# Patient Record
Sex: Male | Born: 1985
Health system: Southern US, Community
[De-identification: ages and names within clinical notes are randomized; demographics above are authoritative.]

## PROBLEM LIST (undated history)

## (undated) DIAGNOSIS — S86019A Strain of unspecified Achilles tendon, initial encounter: Secondary | ICD-10-CM

## (undated) DIAGNOSIS — F4310 Post-traumatic stress disorder, unspecified: Secondary | ICD-10-CM

## (undated) DIAGNOSIS — K219 Gastro-esophageal reflux disease without esophagitis: Secondary | ICD-10-CM

## (undated) DIAGNOSIS — F410 Panic disorder [episodic paroxysmal anxiety] without agoraphobia: Secondary | ICD-10-CM

## (undated) HISTORY — PX: ACHILLES TENDON REPAIR: SUR1153

## (undated) HISTORY — PX: UPPER GASTROINTESTINAL ENDOSCOPY: SHX188

---

## 2007-07-15 ENCOUNTER — Other Ambulatory Visit: Payer: Self-pay

## 2007-07-15 ENCOUNTER — Emergency Department: Payer: Self-pay | Admitting: Unknown Physician Specialty

## 2011-11-15 ENCOUNTER — Emergency Department (HOSPITAL_BASED_OUTPATIENT_CLINIC_OR_DEPARTMENT_OTHER)
Admission: EM | Admit: 2011-11-15 | Discharge: 2011-11-15 | Disposition: A | Discharge status: Home / Self Care | Payer: Worker's Compensation | Attending: Emergency Medicine | Admitting: Emergency Medicine

## 2011-11-15 ENCOUNTER — Emergency Department (INDEPENDENT_AMBULATORY_CARE_PROVIDER_SITE_OTHER): Payer: Worker's Compensation

## 2011-11-15 ENCOUNTER — Encounter (HOSPITAL_BASED_OUTPATIENT_CLINIC_OR_DEPARTMENT_OTHER): Payer: Self-pay | Admitting: *Deleted

## 2011-11-15 DIAGNOSIS — S8392XA Sprain of unspecified site of left knee, initial encounter: Secondary | ICD-10-CM

## 2011-11-15 DIAGNOSIS — X500XXA Overexertion from strenuous movement or load, initial encounter: Secondary | ICD-10-CM | POA: Insufficient documentation

## 2011-11-15 DIAGNOSIS — Y9289 Other specified places as the place of occurrence of the external cause: Secondary | ICD-10-CM | POA: Insufficient documentation

## 2011-11-15 DIAGNOSIS — F17200 Nicotine dependence, unspecified, uncomplicated: Secondary | ICD-10-CM | POA: Insufficient documentation

## 2011-11-15 DIAGNOSIS — IMO0002 Reserved for concepts with insufficient information to code with codable children: Secondary | ICD-10-CM | POA: Insufficient documentation

## 2011-11-15 DIAGNOSIS — M25569 Pain in unspecified knee: Secondary | ICD-10-CM

## 2011-11-15 DIAGNOSIS — K219 Gastro-esophageal reflux disease without esophagitis: Secondary | ICD-10-CM | POA: Insufficient documentation

## 2011-11-15 HISTORY — DX: Post-traumatic stress disorder, unspecified: F43.10

## 2011-11-15 HISTORY — DX: Strain of unspecified achilles tendon, initial encounter: S86.019A

## 2011-11-15 HISTORY — DX: Gastro-esophageal reflux disease without esophagitis: K21.9

## 2011-11-15 HISTORY — DX: Panic disorder (episodic paroxysmal anxiety): F41.0

## 2011-11-15 MED ORDER — OXYCODONE-ACETAMINOPHEN 5-325 MG PO TABS: 1.0000 | ORAL_TABLET | Freq: Four times a day (QID) | ORAL | Status: AC | PRN

## 2011-11-15 MED ORDER — OXYCODONE-ACETAMINOPHEN 5-325 MG PO TABS
1.0000 | ORAL_TABLET | Freq: Once | ORAL | Status: AC
  Administered 2011-11-15: 1 via ORAL
  Filled 2011-11-15: qty 1

## 2011-11-15 NOTE — ED Provider Notes (Signed)
History     CSN: [id number]  Arrival date & time 11/15/11  6433   First MD Initiated Contact with Patient 11/15/11 1000      Chief Complaint  Patient presents with  . Knee Pain    (Consider location/radiation/quality/duration/timing/severity/associated sxs/prior treatment) Patient is a 26 y.o. male presenting with knee pain. The history is provided by the patient.  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 He states his foot got caught between 2 boxes in his left knee twisted. He was working later that he get out and a box of soup landed on the medial aspect of his knee. He's had pain. No numbness or weakness. No other injury.  Past Medical History  Diagnosis Date  . Post traumatic stress disorder   . Achilles tendon rupture   . Panic attack   . GERD (gastroesophageal reflux disease)     Past Surgical History  Procedure Date  . Achilles tendon repair   . Upper gastrointestinal endoscopy     No family history on file.  History  Substance Use Topics  . Smoking status: Current Everyday Smoker -- 1.0 packs/day for 10 years    Types: Cigarettes  . Smokeless tobacco: Not on file  . Alcohol Use: Yes     occassionally      Review of Systems  Musculoskeletal: Positive for joint swelling. Negative for arthralgias.  Skin: Negative for pallor and rash.  Neurological: Negative for weakness and numbness.    Allergies  Phenergan  Home Medications   Current Outpatient Rx  Name Route Sig Dispense Refill  . CLONIDINE HCL 0.1 MG PO TABS Oral Take 0.1 mg by mouth 2 (two) times daily.    Marland Kitchen PRAZOSIN HCL 1 MG PO CAPS Oral Take 1 mg by mouth at bedtime.    . SERTRALINE HCL 100 MG PO TABS Oral Take 100 mg by mouth daily.    Marland Kitchen ZOLPIDEM TARTRATE 10 MG PO TABS Oral Take 10 mg by mouth at bedtime as needed.    . OXYCODONE-ACETAMINOPHEN 5-325 MG PO TABS Oral Take 1-2 tablets by mouth every 6 (six) hours as needed for pain. 20 tablet 0    BP 123/74  Pulse  83  Temp(Src) 97.6 F (36.4 C) (Oral)  Resp 20  Ht 5\' 7"  (1.702 m)  Wt 160 lb (72.576 kg)  BMI 25.06 kg/m2  SpO2 98%  Physical Exam  Constitutional: He is oriented to person, place, and time. He appears well-developed.  HENT:  Head: Normocephalic.  Musculoskeletal:       Tenderness to left knee anteriorly and medially. Pain with valgus stress. Neurovascularly intact distally. No effusion. Range of motion mildly decreased due to pain.  Neurological: He is alert and oriented to person, place, and time.    ED Course  Procedures (including critical care time)  Labs Reviewed - No data to display Dg Knee Complete 4 Views Left  11/15/2011  *RADIOLOGY REPORT*  Clinical Data: Twisting injury, pain.  LEFT KNEE - COMPLETE 4+ VIEW  Comparison: None.  Findings: Imaged bones, joints and soft tissues appear normal.  IMPRESSION: Negative study.  Original Report Authenticated By: Bernadene Bell. D'ALESSIO, M.D.     1. Sprain of left knee       MDM  Knee pain. Negative x-ray. Was given an immobilizer pain medicines and will followup.        Juliet Rude. Rubin Payor, MD 11/15/11 [phone]

## 2011-11-15 NOTE — ED Notes (Signed)
Patient states he was at work and caught his foot between two boxes and his left knee twisted.  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  States his knee gave out on him while carrying a case of soup cans causing him to fall and the case of soup landed on his medial side of his left knee.

## 2016-02-01 ENCOUNTER — Ambulatory Visit: Payer: Self-pay | Admitting: Family Medicine

## 2016-06-11 ENCOUNTER — Encounter (HOSPITAL_BASED_OUTPATIENT_CLINIC_OR_DEPARTMENT_OTHER): Payer: Self-pay

## 2016-06-11 ENCOUNTER — Emergency Department (HOSPITAL_BASED_OUTPATIENT_CLINIC_OR_DEPARTMENT_OTHER)
Admission: EM | Admit: 2016-06-11 | Discharge: 2016-06-11 | Disposition: A | Discharge status: Home / Self Care | Payer: Worker's Compensation | Attending: Emergency Medicine | Admitting: Emergency Medicine

## 2016-06-11 DIAGNOSIS — Y990 Civilian activity done for income or pay: Secondary | ICD-10-CM | POA: Insufficient documentation

## 2016-06-11 DIAGNOSIS — Y93F2 Activity, caregiving, lifting: Secondary | ICD-10-CM | POA: Insufficient documentation

## 2016-06-11 DIAGNOSIS — M5431 Sciatica, right side: Secondary | ICD-10-CM

## 2016-06-11 DIAGNOSIS — X500XXA Overexertion from strenuous movement or load, initial encounter: Secondary | ICD-10-CM | POA: Insufficient documentation

## 2016-06-11 DIAGNOSIS — Y929 Unspecified place or not applicable: Secondary | ICD-10-CM | POA: Insufficient documentation

## 2016-06-11 DIAGNOSIS — S39012A Strain of muscle, fascia and tendon of lower back, initial encounter: Secondary | ICD-10-CM

## 2016-06-11 DIAGNOSIS — F17210 Nicotine dependence, cigarettes, uncomplicated: Secondary | ICD-10-CM | POA: Insufficient documentation

## 2016-06-11 LAB — CBC WITH DIFFERENTIAL/PLATELET
Basophils Absolute: 0 10*3/uL (ref 0.0–0.1)
Basophils Relative: 0 %
EOS ABS: 0.1 10*3/uL (ref 0.0–0.7)
EOS PCT: 1 %
HCT: 37.9 % — ABNORMAL LOW (ref 39.0–52.0)
Hemoglobin: 12.9 g/dL — ABNORMAL LOW (ref 13.0–17.0)
LYMPHS ABS: 1.5 10*3/uL (ref 0.7–4.0)
Lymphocytes Relative: 19 %
MCH: 32.9 pg (ref 26.0–34.0)
MCHC: 34 g/dL (ref 30.0–36.0)
MCV: 96.7 fL (ref 78.0–100.0)
MONOS PCT: 5 %
Monocytes Absolute: 0.4 10*3/uL (ref 0.1–1.0)
Neutro Abs: 6 10*3/uL (ref 1.7–7.7)
Neutrophils Relative %: 75 %
PLATELETS: 194 10*3/uL (ref 150–400)
RBC: 3.92 MIL/uL — ABNORMAL LOW (ref 4.22–5.81)
RDW: 12.8 % (ref 11.5–15.5)
WBC: 8.1 10*3/uL (ref 4.0–10.5)

## 2016-06-11 LAB — BASIC METABOLIC PANEL
Anion gap: 5 (ref 5–15)
BUN: 7 mg/dL (ref 6–20)
CO2: 26 mmol/L (ref 22–32)
CREATININE: 0.7 mg/dL (ref 0.61–1.24)
Calcium: 9 mg/dL (ref 8.9–10.3)
Chloride: 107 mmol/L (ref 101–111)
GFR calc Af Amer: >60 mL/min (ref >60)
Glucose, Bld: 90 mg/dL (ref 65–99)
Potassium: 3.8 mmol/L (ref 3.5–5.1)
SODIUM: 138 mmol/L (ref 135–145)

## 2016-06-11 MED ORDER — HYDROMORPHONE HCL 1 MG/ML IJ SOLN
INTRAMUSCULAR | Status: AC
  Filled 2016-06-11: qty 1

## 2016-06-11 MED ORDER — DIAZEPAM 5 MG/ML IJ SOLN
5.0000 mg | Freq: Once | INTRAMUSCULAR | Status: AC
  Administered 2016-06-11: 5 mg via INTRAVENOUS
  Filled 2016-06-11: qty 2

## 2016-06-11 MED ORDER — KETOROLAC TROMETHAMINE 60 MG/2ML IM SOLN
60.0000 mg | Freq: Once | INTRAMUSCULAR | Status: AC
  Administered 2016-06-11: 60 mg via INTRAMUSCULAR
  Filled 2016-06-11: qty 2

## 2016-06-11 MED ORDER — HYDROMORPHONE HCL 1 MG/ML IJ SOLN
1.0000 mg | Freq: Once | INTRAMUSCULAR | Status: AC
  Administered 2016-06-11: 1 mg via INTRAVENOUS
  Filled 2016-06-11: qty 1

## 2016-06-11 MED ORDER — DIAZEPAM 5 MG PO TABS: 5.0000 mg | ORAL_TABLET | Freq: Four times a day (QID) | ORAL | 0 refills | Status: DC | PRN

## 2016-06-11 MED ORDER — IBUPROFEN 600 MG PO TABS: 600.0000 mg | ORAL_TABLET | Freq: Four times a day (QID) | ORAL | 0 refills | Status: AC | PRN

## 2016-06-11 MED ORDER — HYDROCODONE-ACETAMINOPHEN 5-325 MG PO TABS: 1.0000 | ORAL_TABLET | Freq: Four times a day (QID) | ORAL | 0 refills | Status: AC | PRN

## 2016-06-11 MED ORDER — MORPHINE SULFATE (PF) 4 MG/ML IV SOLN
4.0000 mg | Freq: Once | INTRAVENOUS | Status: AC
  Administered 2016-06-11: 4 mg via INTRAMUSCULAR
  Filled 2016-06-11: qty 1

## 2016-06-11 MED ORDER — DIAZEPAM 5 MG/ML IJ SOLN
5.0000 mg | Freq: Once | INTRAMUSCULAR | Status: AC
  Administered 2016-06-11: 5 mg via INTRAMUSCULAR
  Filled 2016-06-11: qty 2

## 2016-06-11 MED FILL — HYDROCODON-APAP 5-325: 5-325 | 2 days supply | Qty: 10 | Fill #0

## 2016-06-11 MED FILL — IBUPROFEN 600 MG TABLET: 600 | 7 days supply | Qty: 30 | Fill #0

## 2016-06-11 MED FILL — diazePAM 5 MG TABS: 5 | 2 days supply | Qty: 10 | Fill #0

## 2016-06-11 NOTE — Discharge Instructions (Signed)
Take motrin for pain.  Take valium as needed for spasms. Do NOT drive with it  Take vicodin for severe pain.   No heavy lifting for a week.   See neurosurgery for follow up to discuss options   Return to ER if you have severe pain, unable to walk, weakness, numbness, trouble urinating.

## 2016-06-11 NOTE — ED Notes (Addendum)
After scanning dilaudid at bedside, this RN dropped the whole vial into the sharps box before withdrawing the medication. Consulting civil engineerCharge RN and pharmacy advised. Pharmacy tech advised to over ride in the pyxsis to get pt a new dose.

## 2016-06-11 NOTE — ED Triage Notes (Signed)
C/o lower back pain that radiates down right leg after [facility]-steady gait

## 2016-06-11 NOTE — ED Provider Notes (Signed)
[facility] Provider [facility]   CSN: [id number] Arrival date & time: 06/11/16  1139  First Provider Contact:  First MD Initiated Contact with Patient 06/11/16 1144        History   Chief Complaint Chief Complaint  Patient presents with  . Back Pain    HPI Kevin Stephens is a 30 y.o. male otherwise healthy here presenting with back pain.  Patient states that he is at work and was picking up something heavy and had acute onset of back pain radiating down the right leg.  He felt like something may have popped but denies any fall or injury to the back. Denies any weakness or numbness or incontinence. Denies any previous history of spinal issues or any spinal surgeries.     The history is provided by the patient.    Past Medical History:  Diagnosis Date  . Achilles tendon rupture   . GERD (gastroesophageal reflux disease)   . Panic attack   . Post traumatic stress disorder     There are no active problems to display for this patient.   Past Surgical History:  Procedure Laterality Date  . ACHILLES TENDON REPAIR    . UPPER GASTROINTESTINAL ENDOSCOPY         Home Medications    Prior to Admission medications   Not on File    Family History No family history on file.  Social History Social History  Substance Use Topics  . Smoking status: Current Every Day Smoker    Packs/day: 1.00    Years: 10.00    Types: Cigarettes  . Smokeless tobacco: Never Used  . Alcohol use Yes     Comment: occassionally     Allergies   Promethazine hcl   Review of Systems Review of Systems  Musculoskeletal: Positive for back pain.  All other systems reviewed and are negative.    Physical Exam Updated Vital Signs BP 138/93 (BP Location: Right Arm)   Pulse 90   Temp 98.3 F (36.8 C) (Oral)   Resp 16   Ht  (1.727 m)   Wt 160 lb (72.6 kg)   SpO2 99%   BMI 24.33 kg/m   Physical Exam  Constitutional:  Uncomfortable   HENT:  Head: Normocephalic.  Eyes:  EOM are normal. Pupils are equal, round, and reactive to light.  Neck: Normal range of motion.  Cardiovascular: Normal rate, regular rhythm and normal heart sounds.   Pulmonary/Chest: Effort normal and breath sounds normal. No respiratory distress. He has no wheezes. He has no rales.  Abdominal: Soft. Bowel sounds are normal. He exhibits no distension. There is no tenderness. There is no guarding.  Musculoskeletal: Normal range of motion.  R paralumbar spasms. No obvious midline tenderness. + straight leg raise on R side.   Neurological: He is alert.  No saddle anesthesia. + straight leg raise on the R side   Skin: Skin is warm.  Psychiatric: He has a normal mood and affect.  Nursing note and vitals reviewed.    ED Treatments / Results  Labs (all labs ordered are listed, but only abnormal results are displayed) Labs Reviewed  CBC WITH DIFFERENTIAL/PLATELET - Abnormal; Notable for the following:       Result Value   RBC 3.92 (*)    Hemoglobin 12.9 (*)    HCT 37.9 (*)    All other components within normal limits  BASIC METABOLIC PANEL    EKG  EKG Interpretation None  Radiology No results found.  Procedures Procedures (including critical care time)  Medications Ordered in ED Medications  HYDROmorphone (DILAUDID) 1 MG/ML injection (not administered)  ketorolac (TORADOL) injection 60 mg (60 mg Intramuscular Given 06/11/16 1213)  diazepam (VALIUM) injection 5 mg (5 mg Intramuscular Given 06/11/16 1213)  morphine 4 MG/ML injection 4 mg (4 mg Intramuscular Given 06/11/16 1213)  HYDROmorphone (DILAUDID) injection 1 mg (1 mg Intravenous Given 06/11/16 1319)  diazepam (VALIUM) injection 5 mg (5 mg Intravenous Given 06/11/16 1319)     Initial Impression / Assessment and Plan / ED Course  I have reviewed the triage vital signs and the nursing notes.  Pertinent labs & imaging results that were available during my care of the patient were reviewed by me and considered in my medical  decision making (see chart for details).  Clinical Course    Kevin Stephens is a 30 y.o. male here with back pain with sciatica. + straight leg raise on the right, but neurovascular intact. No trauma or injury. Will give pain meds and reassess.   1 pm Still in severe pain despite IM meds. Will place IV and give IV pain meds.   2:30 PM Pain improved. Able to ambulate to the bathroom. Likely sciatica. Will dc home with motrin, valium, prn vicodin. Will have him see neurosurgery.    Final Clinical Impressions(s) / ED Diagnoses   Final diagnoses:  None    New Prescriptions New Prescriptions   No medications on file     [name], MD 06/11/16 1431

## 2016-06-17 ENCOUNTER — Emergency Department (HOSPITAL_BASED_OUTPATIENT_CLINIC_OR_DEPARTMENT_OTHER): Payer: Worker's Compensation

## 2016-06-17 ENCOUNTER — Encounter (HOSPITAL_BASED_OUTPATIENT_CLINIC_OR_DEPARTMENT_OTHER): Payer: Self-pay | Admitting: *Deleted

## 2016-06-17 ENCOUNTER — Emergency Department (HOSPITAL_BASED_OUTPATIENT_CLINIC_OR_DEPARTMENT_OTHER)
Admission: EM | Admit: 2016-06-17 | Discharge: 2016-06-17 | Disposition: A | Discharge status: Home / Self Care | Payer: Worker's Compensation | Attending: Emergency Medicine | Admitting: Emergency Medicine

## 2016-06-17 DIAGNOSIS — M545 Low back pain: Secondary | ICD-10-CM | POA: Diagnosis present

## 2016-06-17 DIAGNOSIS — R3915 Urgency of urination: Secondary | ICD-10-CM | POA: Diagnosis not present

## 2016-06-17 DIAGNOSIS — F17210 Nicotine dependence, cigarettes, uncomplicated: Secondary | ICD-10-CM | POA: Insufficient documentation

## 2016-06-17 DIAGNOSIS — M5441 Lumbago with sciatica, right side: Secondary | ICD-10-CM | POA: Insufficient documentation

## 2016-06-17 DIAGNOSIS — M5431 Sciatica, right side: Secondary | ICD-10-CM

## 2016-06-17 MED ORDER — DIAZEPAM 5 MG PO TABS
10.0000 mg | ORAL_TABLET | Freq: Once | ORAL | Status: AC
  Administered 2016-06-17: 10 mg via ORAL
  Filled 2016-06-17: qty 2

## 2016-06-17 MED ORDER — DIAZEPAM 5 MG PO TABS: 5.0000 mg | ORAL_TABLET | Freq: Four times a day (QID) | ORAL | 0 refills | Status: AC | PRN

## 2016-06-17 MED ORDER — TRAMADOL HCL 50 MG PO TABS
50.0000 mg | ORAL_TABLET | Freq: Four times a day (QID) | ORAL | 0 refills | Status: AC | PRN
Start: 2016-06-17 — End: ?

## 2016-06-17 MED ORDER — PREDNISONE 20 MG PO TABS: ORAL_TABLET | ORAL | 0 refills | Status: AC

## 2016-06-17 MED ORDER — OXYCODONE-ACETAMINOPHEN 5-325 MG PO TABS
2.0000 | ORAL_TABLET | Freq: Once | ORAL | Status: AC
  Administered 2016-06-17: 2 via ORAL
  Filled 2016-06-17: qty 2

## 2016-06-17 MED ORDER — KETOROLAC TROMETHAMINE 60 MG/2ML IM SOLN
60.0000 mg | Freq: Once | INTRAMUSCULAR | Status: AC
  Administered 2016-06-17: 60 mg via INTRAMUSCULAR
  Filled 2016-06-17: qty 2

## 2016-06-17 NOTE — ED Notes (Signed)
Attempted to walk pt and he stated he was in pain

## 2016-06-17 NOTE — ED Triage Notes (Signed)
Back pain. States he has been trying to get in touch with his referral this am with any success. Felt it would be better for continuity of care if he stuck with coming to the ED.

## 2016-06-17 NOTE — ED Notes (Signed)
Pt. Was seen here on last Tuesday for same complaint and reports he is in pain

## 2016-06-17 NOTE — Discharge Instructions (Signed)
Please call and [facility] for [facility] of your [facility].  After that, call neurosurgeon Dr. Lonie Peakram's office after 9am for outpatient follow up.

## 2016-06-17 NOTE — ED Notes (Signed)
[facility]--spoke with Kevin Stephens for consult

## 2016-06-17 NOTE — ED Provider Notes (Signed)
[facility] Provider Note   CSN: [id number]   By signing my name below, I, Phillis HaggisGabriella Gaje, attest that this documentation has been prepared under the direction and in the presence of Fayrene HelperBowie Camdan Burdi, PA-C. Electronically Signed: Phillis HaggisGabriella Gaje, ED Scribe. 06/17/16. 4:20 PM.  Arrival date & time: 06/17/16  1355  History   Chief Complaint Chief Complaint  Patient presents with  . Back Pain   The history is provided by the patient. No language interpreter was used.  HPI Comments: Kevin Stephens is a 30 y.o. male who presents to the Emergency Department complaining of gradually worsening, right sided, shooting lower back pain that intermittently radiates down the right leg to the knee onset one week ago. Pt reports associated urinary urgency and tingling in the toes of his right foot. He states that he had an incident of having a BM on himself yesterday. He states that he had the sensation of having a BM, but it came much quicker than he was expecting or prepared for. He reports worsening pain with sudden movements. Pt was seen last week in the ED and prescribed multiple pain medications but has since run out. He was told to follow up with [facility]. He says that he has called multiple times but has not gotten through to them. He initially injured his back with heavy lifting at work. He denies IVDU, hx of cancer, numbness, weakness, dysuria, or bladder incontinence.   Past Medical History:  Diagnosis Date  . Achilles tendon rupture   . GERD (gastroesophageal reflux disease)   . Panic attack   . Post traumatic stress disorder     There are no active problems to display for this patient.   Past Surgical History:  Procedure Laterality Date  . ACHILLES TENDON REPAIR    . UPPER GASTROINTESTINAL ENDOSCOPY        Home Medications    Prior to Admission medications   Medication Sig Start Date End Date Taking? Authorizing Provider  diazepam (VALIUM) 5 MG tablet Take 1  tablet (5 mg total) by mouth every 6 (six) hours as needed for anxiety (spasms). 06/11/16   Charlynne Panderavid Hsienta Yao, MD  HYDROcodone-acetaminophen (NORCO/VICODIN) 5-325 MG tablet Take 1 tablet by mouth every 6 (six) hours as needed. 06/11/16   Charlynne Panderavid Hsienta Yao, MD  ibuprofen (ADVIL,MOTRIN) 600 MG tablet Take 1 tablet (600 mg total) by mouth every 6 (six) hours as needed. 06/11/16   Charlynne Panderavid Hsienta Yao, MD    Family History No family history on file.  Social History Social History  Substance Use Topics  . Smoking status: Current Every Day Smoker    Packs/day: 1.00    Years: 10.00    Types: Cigarettes  . Smokeless tobacco: Never Used  . Alcohol use Yes     Comment: occassionally     Allergies   Promethazine hcl   Review of Systems Review of Systems  Genitourinary: Positive for urgency. Negative for dysuria and hematuria.  Musculoskeletal: Positive for back pain.  Neurological: Negative for weakness and numbness.     Physical Exam Updated Vital Signs BP 140/100   Pulse 72   Temp 97.7 F (36.5 C) (Oral)   Resp 16   Ht 5\' 8"  (1.727 m)   Wt 160 lb (72.6 kg)   SpO2 100%   BMI 24.33 kg/m   Physical Exam  Constitutional: He is oriented to person, place, and time. He appears well-developed and well-nourished.  HENT:  Head: Normocephalic and atraumatic.  Eyes:  Conjunctivae and EOM are normal. Pupils are equal, round, and reactive to light.  Neck: Normal range of motion. Neck supple.  Genitourinary:  Genitourinary Comments: Rectal tone intact; chaperone present  Musculoskeletal:  Gait normal; NVI; brisk cap refill. Tenderness of lumbar spine to L3-S1; tenderness to palpation of right paralumbar spinal muscles with no overlying skin changes. Patellar reflexes intact bilaterally  Neurological: He is alert and oriented to person, place, and time. Coordination normal.  4/5 Strength to RLE; 5/5 strength to LLE. Sensation intact  Skin: Skin is warm and dry.  Psychiatric: He has a normal  mood and affect. His behavior is normal.  Nursing note and vitals reviewed.    ED Treatments / Results  DIAGNOSTIC STUDIES: Oxygen Saturation is 100% on RA, normal by my interpretation.    COORDINATION OF CARE: 4:19 PM-Discussed treatment plan which includes Toradol with pt at bedside and pt agreed to plan.    Labs (all labs ordered are listed, but only abnormal results are displayed) Labs Reviewed - No data to display  EKG  EKG Interpretation None       Radiology Dg Lumbar Spine Complete  Result Date: 06/17/2016 CLINICAL DATA:  Lifting injury 1 week ago, low back pain, right lower extremity pain EXAM: LUMBAR SPINE - COMPLETE 4+ VIEW COMPARISON:  12/20/2014 FINDINGS: Five views of the lumbar spine submitted. No acute fracture or subluxation. Alignment, disc spaces and vertebral body heights are preserved. IMPRESSION: No acute fracture or subluxation. Electronically Signed   By: Natasha MeadLiviu  Pop M.D.   On: 06/17/2016 16:57    Procedures Procedures (including critical care time)  Medications Ordered in ED Medications  ketorolac (TORADOL) injection 60 mg (60 mg Intramuscular Given 06/17/16 1631)  diazepam (VALIUM) tablet 10 mg (10 mg Oral Given 06/17/16 1630)     Initial Impression / Assessment and Plan / ED Course  I have reviewed the triage vital signs and the nursing notes.  Pertinent labs & imaging results that were available during my care of the patient were reviewed by me and considered in my medical decision making (see chart for details).  Clinical Course    BP 140/100   Pulse 72   Temp 97.7 F (36.5 C) (Oral)   Resp 16   Ht 5\' 8"  (1.727 m)   Wt 72.6 kg   SpO2 100%   BMI 24.33 kg/m    Final Clinical Impressions(s) / ED Diagnoses   Final diagnoses:  Sciatica of right side   I personally performed the services described in this documentation, which was scribed in my presence. The recorded information has been reviewed and is accurate.     New  Prescriptions New Prescriptions   PREDNISONE (DELTASONE) 20 MG TABLET    3 tabs po day one, then 2 tabs daily x 4 days   TRAMADOL (ULTRAM) 50 MG TABLET    Take 1 tablet (50 mg total) by mouth every 6 (six) hours as needed.   5:56 PM Pt here with R side sciatica.  No true red flags.  Able to ambulate with discomfort.  Normal rectal tone, able to urinate.  I have consulted with neurosurgeon Dr. Wynetta Emeryram who recommend outpt MRI of Lspine and to f/u outpt for further care.     Fayrene HelperBowie Dinorah Masullo, PA-C 06/17/16 1756     Fayrene HelperBowie Kazmir Oki, PA-C 06/17/16 1757    Loren Raceravid Yelverton, MD 06/21/16 1600

## 2017-09-25 IMAGING — DX DG LUMBAR SPINE COMPLETE 4+V
5 series · 5 of 5 positions shown · non-contrast
Comparison: 12/20/2014

CLINICAL DATA: Lifting injury 1 week ago, low back pain, right
lower extremity pain

EXAM:
LUMBAR SPINE - COMPLETE 4+ VIEW

[l-spine ap]
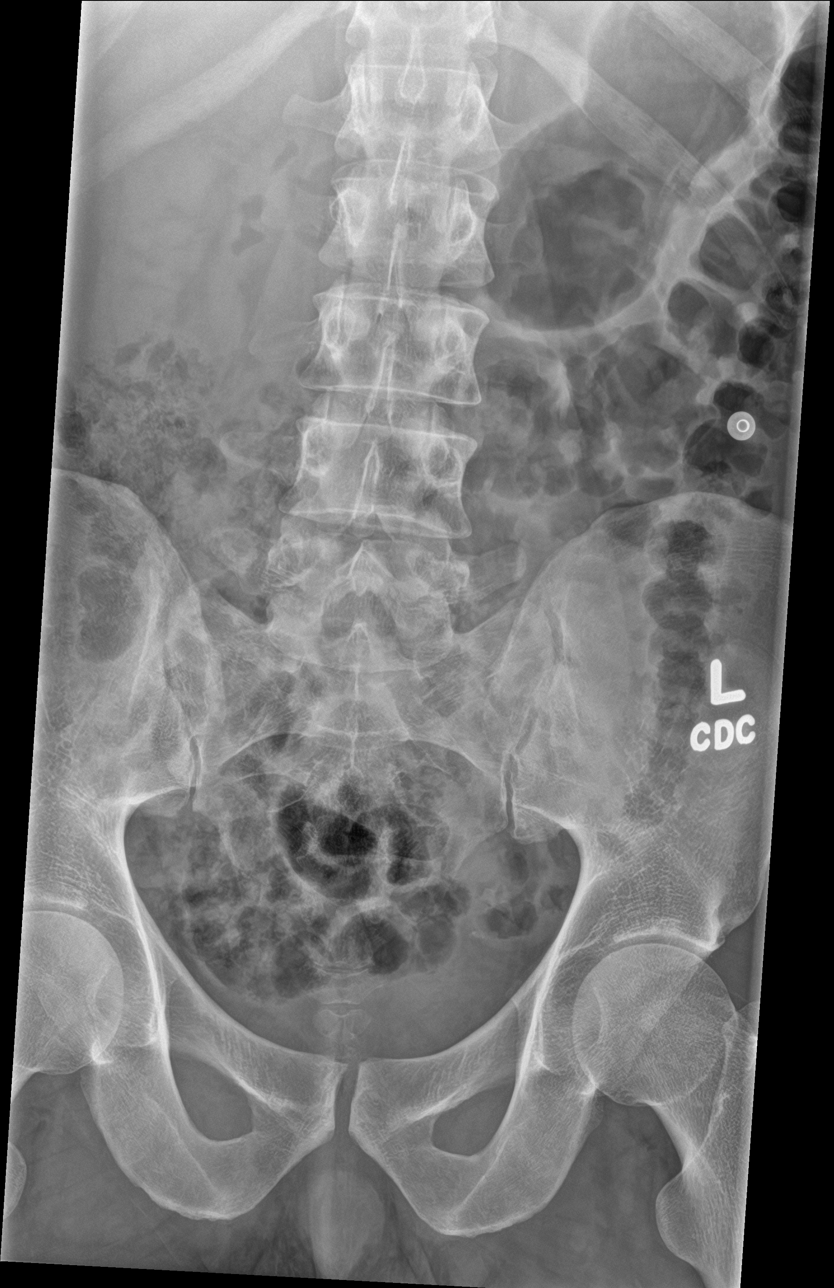

[l-spine obl (1 of 2)]
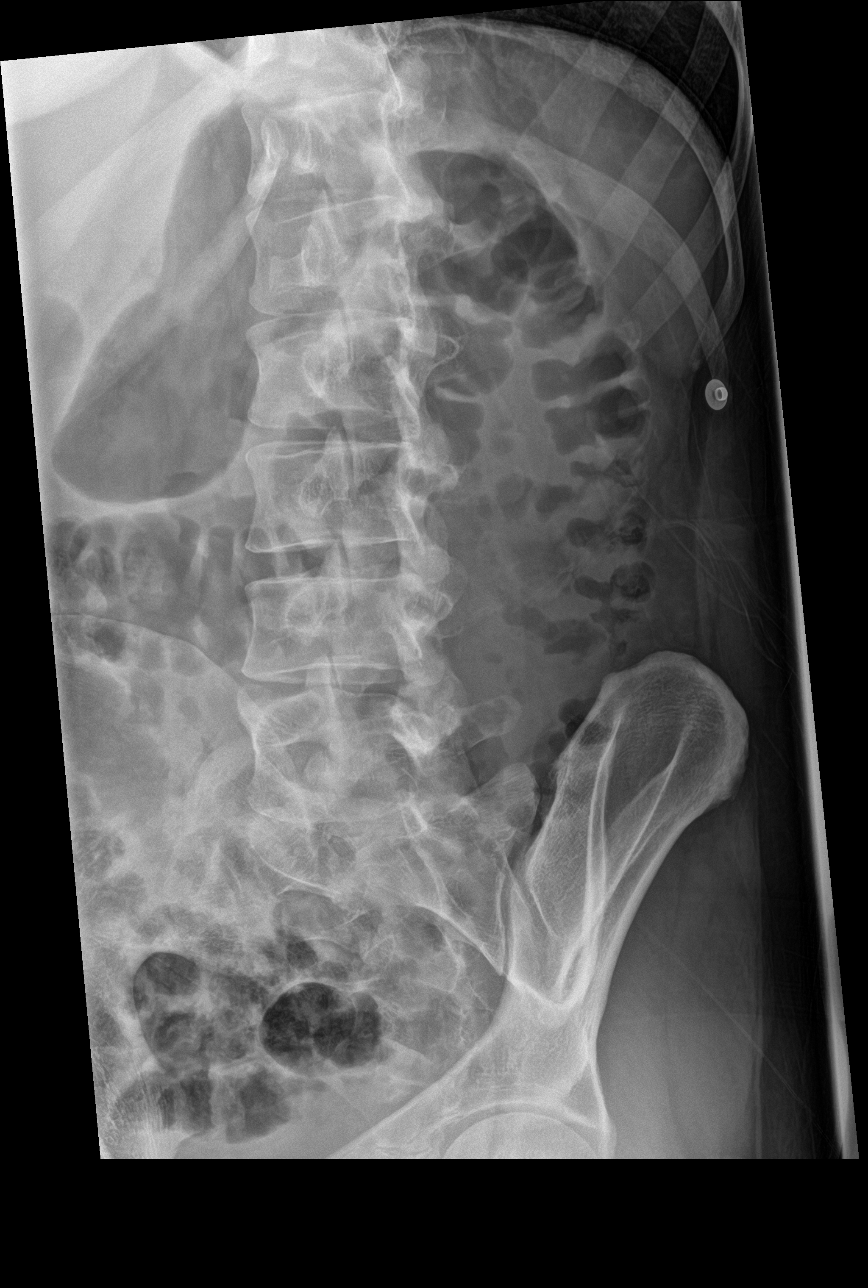

[l-spine obl (2 of 2)]
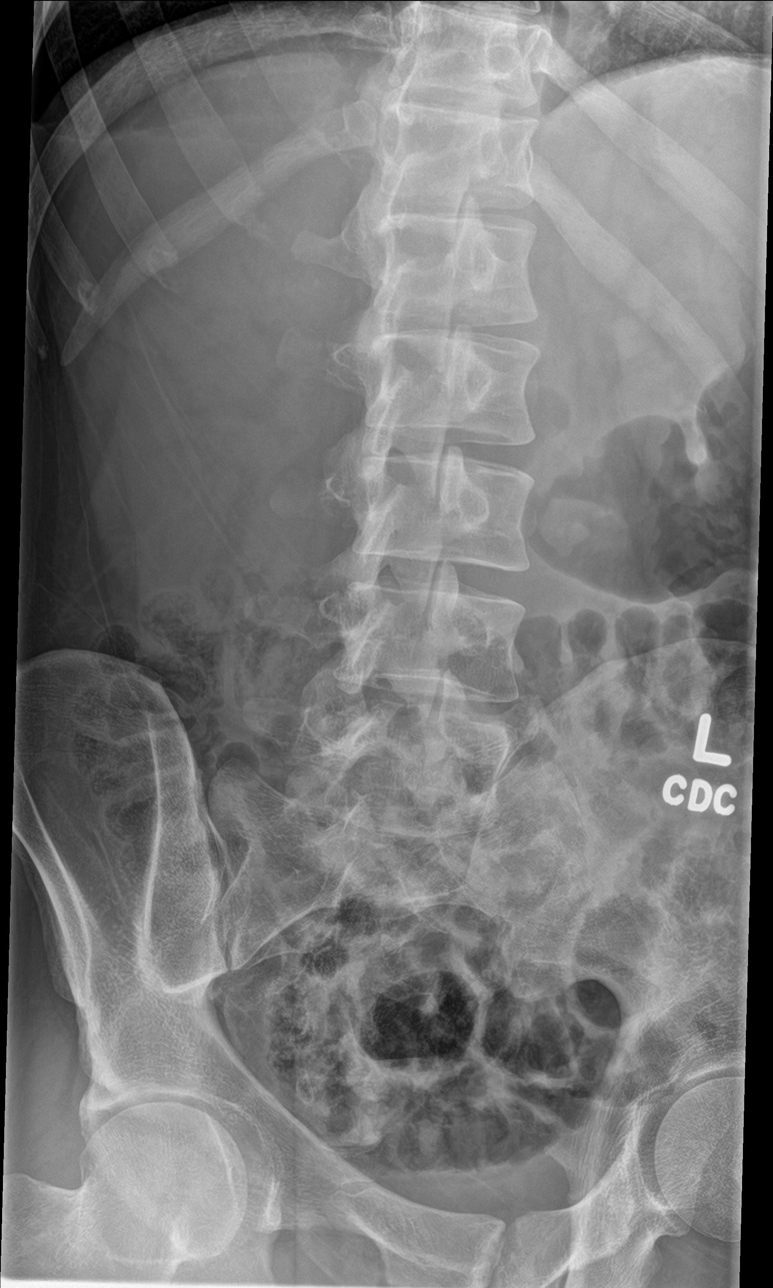

[l-spine lat]
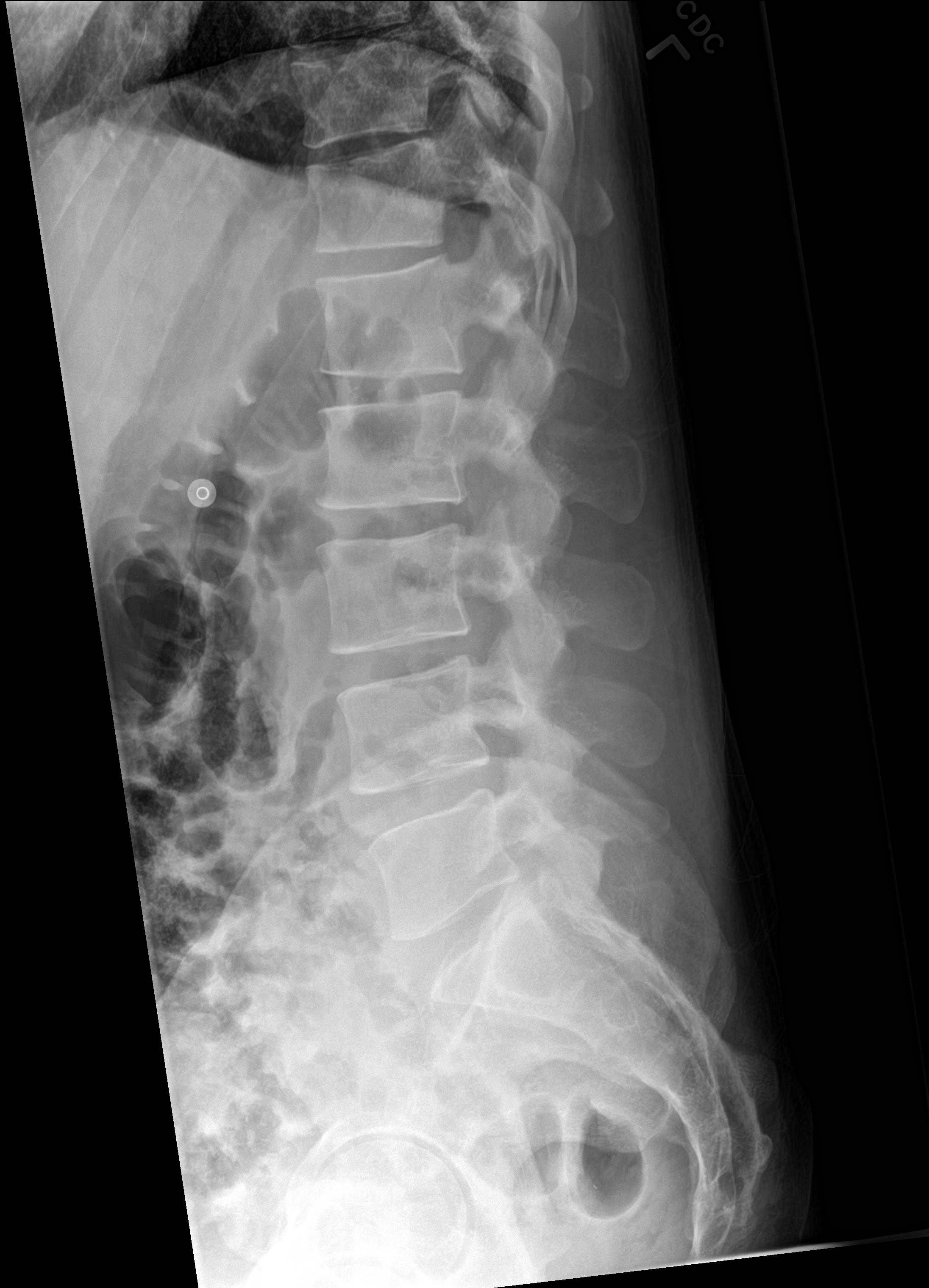

[l-spine spot]
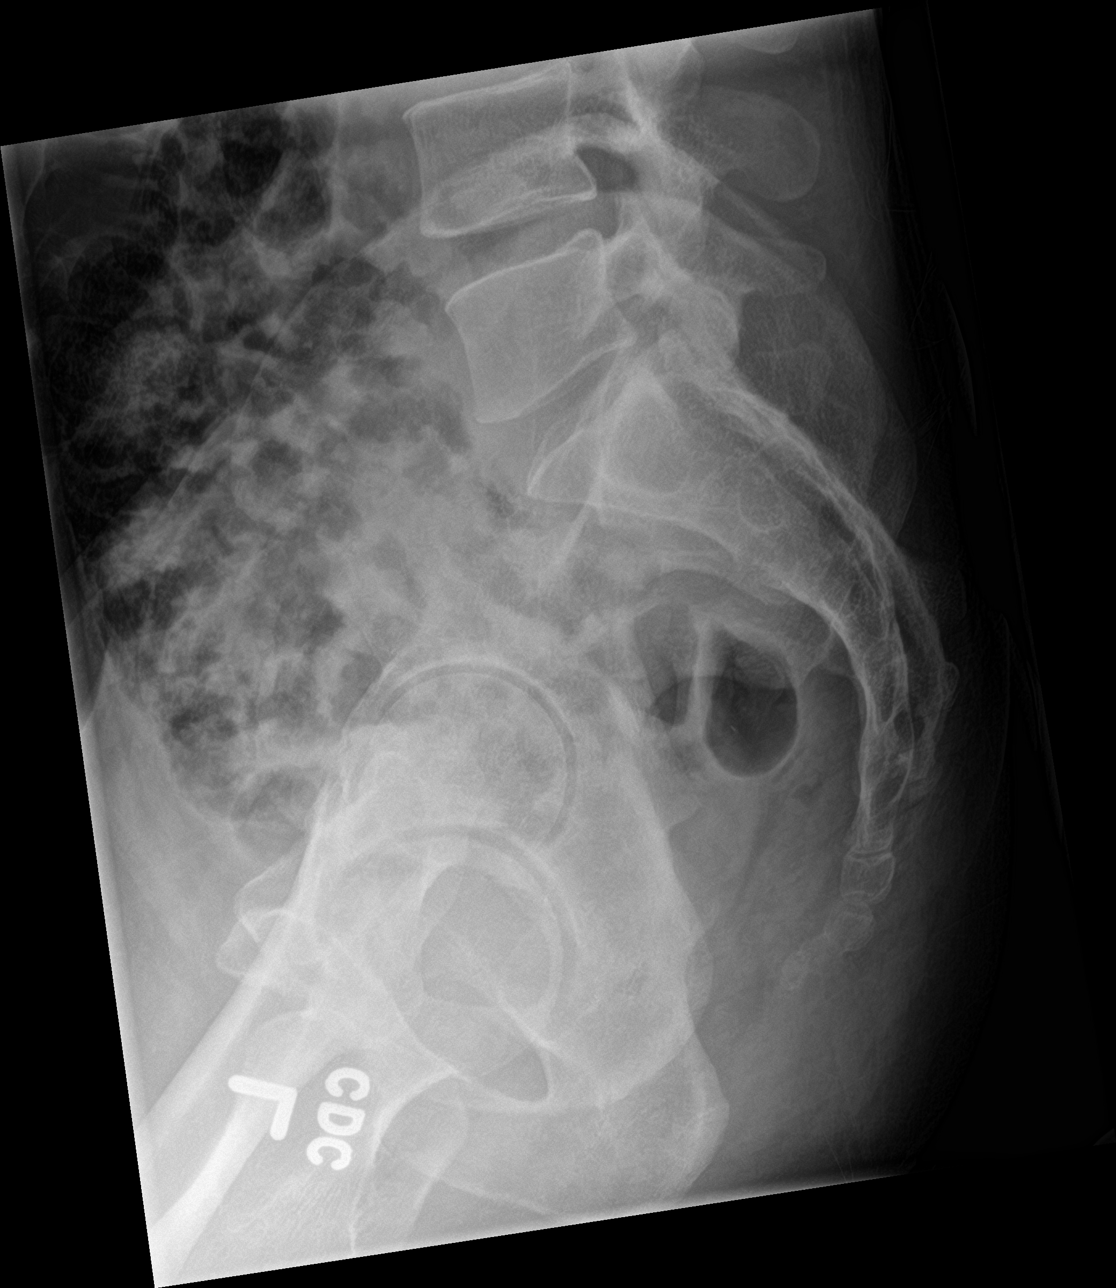

[5 of 5 positions shown; findings below may reference images not displayed]

FINDINGS: Five views of the lumbar spine submitted. No acute fracture or
subluxation. Alignment, disc spaces and vertebral body heights are
preserved.
IMPRESSION: No acute fracture or subluxation.

## 2018-03-27 ENCOUNTER — Emergency Department (HOSPITAL_BASED_OUTPATIENT_CLINIC_OR_DEPARTMENT_OTHER)
Admission: EM | Admit: 2018-03-27 | Discharge: 2018-03-27 | Disposition: A | Discharge status: Home / Self Care | Payer: Self-pay | Attending: Emergency Medicine | Admitting: Emergency Medicine

## 2018-03-27 ENCOUNTER — Encounter (HOSPITAL_BASED_OUTPATIENT_CLINIC_OR_DEPARTMENT_OTHER): Payer: Self-pay

## 2018-03-27 ENCOUNTER — Other Ambulatory Visit: Payer: Self-pay

## 2018-03-27 ENCOUNTER — Emergency Department (HOSPITAL_BASED_OUTPATIENT_CLINIC_OR_DEPARTMENT_OTHER): Payer: Self-pay

## 2018-03-27 DIAGNOSIS — R0602 Shortness of breath: Secondary | ICD-10-CM | POA: Insufficient documentation

## 2018-03-27 DIAGNOSIS — F17210 Nicotine dependence, cigarettes, uncomplicated: Secondary | ICD-10-CM | POA: Insufficient documentation

## 2018-03-27 DIAGNOSIS — Z79899 Other long term (current) drug therapy: Secondary | ICD-10-CM | POA: Insufficient documentation

## 2018-03-27 DIAGNOSIS — R55 Syncope and collapse: Secondary | ICD-10-CM | POA: Insufficient documentation

## 2018-03-27 NOTE — ED Notes (Signed)
ED Provider at bedside. 

## 2018-03-27 NOTE — ED Triage Notes (Signed)
Pt presents with SOB. Pt able to walk to treatment room in NAD. Breath sounds clear. Pt able to speak in complete sentences with no difficulty. 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 Seen at [facility] last week for allergies, started on zyrtec and prednisone. Pt also reports "having a spot on my lung".

## 2018-03-27 NOTE — ED Notes (Signed)
Pt ambulated to x-ray with steady gate. Pt SpO2 remained at 98% and HR 96.

## 2018-03-27 NOTE — ED Provider Notes (Signed)
[facility] EMERGENCY DEPARTMENT Provider Note   CSN: [id number] Arrival date & time: 03/27/18  2956     History   Chief Complaint Chief Complaint  Patient presents with  . Shortness of Breath    HPI Kevin Stephens is a 32 y.o. male.  The history is provided by the patient. No language interpreter was used.  Shortness of Breath    Kevin Stephens is a 32 y.o. male who presents to the Emergency Department complaining of sob. He presents to the ED for evaluation following an episode of shortness of breath. About two weeks ago he developed nasal congestion with cough and was seen in the [place] and diagnosed with allergies as well as a pulmonary nodule. He was treated with prednisone, Zyrtec and Flonase. Overall his cough and congestion has improved. X-ray and CT scan performed at the [facility] demonstrate a pulmonary nodule that's about 1 cm and he was referred to [facility] for further evaluation and management. Today when he was at work he was working in a non-climate controlled area and a forklift when he developed shortness of breath and soreness in his lungs. He felt lightheaded during this episode and felt like he could not catch his breath. He checked his blood pressure was noted to be 170 systolic with a heart rate of 213. He took his inhaler with no significant improvement in his symptoms and presents to the emergency department for further evaluation. On ED arrival he states that his breathing is back to normal. He had a fever one week ago, now gone. No hemoptysis, night sweats, weight loss, leg swelling or pain, vomiting, diarrhea, abdominal pain.  He smokes cigarettes and has a history of hypertension. No history of DVT or PE. Past Medical History:  Diagnosis Date  . Achilles tendon rupture   . GERD (gastroesophageal reflux disease)   . Panic attack   . Post traumatic stress disorder     There are no active problems to display for this patient.   Past Surgical History:    Procedure Laterality Date  . ACHILLES TENDON REPAIR    . UPPER GASTROINTESTINAL ENDOSCOPY          Home Medications    Prior to Admission medications   Medication Sig Start Date End Date Taking? Authorizing Provider  cetirizine (ZYRTEC) 10 MG tablet Take 10 mg by mouth daily.   Yes [provider]  diazepam (VALIUM) 5 MG tablet Take 1 tablet (5 mg total) by mouth every 6 (six) hours as needed for anxiety (spasms). 06/17/16  Yes Fayrene Helper, PA-C  diphenhydrAMINE (BENADRYL) 50 MG tablet Take 50 mg by mouth at bedtime as needed for itching.   Yes [provider]  fluticasone (FLONASE) 50 MCG/ACT nasal spray Place into both nostrils daily.   Yes [provider]  HYDROcodone-acetaminophen (NORCO/VICODIN) 5-325 MG tablet Take 1 tablet by mouth every 6 (six) hours as needed. 06/11/16  Yes Charlynne Pander, MD  ibuprofen (ADVIL,MOTRIN) 600 MG tablet Take 1 tablet (600 mg total) by mouth every 6 (six) hours as needed. 06/11/16  Yes Charlynne Pander, MD  predniSONE (DELTASONE) 20 MG tablet 3 tabs po day one, then 2 tabs daily x 4 days 06/17/16  Yes Fayrene Helper, PA-C  traMADol (ULTRAM) 50 MG tablet Take 1 tablet (50 mg total) by mouth every 6 (six) hours as needed. 06/17/16  Yes Fayrene Helper, PA-C    Family History No family history on file.  Social History Social  History   Tobacco Use  . Smoking status: Current Every Day Smoker    Packs/day: 1.00    Years: 10.00    Pack years: 10.00    Types: Cigarettes  . Smokeless tobacco: Never Used  Substance Use Topics  . Alcohol use: Yes    Comment: occassionally  . Drug use: Yes    Types: Marijuana     Allergies   Promethazine hcl   Review of Systems Review of Systems  Respiratory: Positive for shortness of breath.   All other systems reviewed and are negative.    Physical Exam Updated Vital Signs BP (!) 160/104 (BP Location: Right Arm)   Pulse 83   Temp 97.8 F (36.6 C) (Oral)   Resp 18   Ht   (1.727 m)   Wt 78 kg (172 lb)   SpO2 100%   BMI 26.15 kg/m   Physical Exam  Constitutional: He is oriented to person, place, and time. He appears well-developed and well-nourished.  HENT:  Head: Normocephalic and atraumatic.  Cardiovascular: Normal rate and regular rhythm.  No murmur heard. Pulmonary/Chest: Effort normal and breath sounds normal. No respiratory distress.  Abdominal: Soft. There is no tenderness. There is no rebound and no guarding.  Musculoskeletal: He exhibits no edema or tenderness.  Neurological: He is alert and oriented to person, place, and time.  Skin: Skin is warm and dry.  Psychiatric: He has a normal mood and affect. His behavior is normal.  Nursing note and vitals reviewed.    ED Treatments / Results  Labs (all labs ordered are listed, but only abnormal results are displayed) Labs Reviewed - No data to display  EKG EKG Interpretation  Date/Time:  Friday Mar 27 2018 10:27:45 EDT Ventricular Rate:  88 PR Interval:    QRS Duration: 87 QT Interval:  384 QTC Calculation: 465 R Axis:   68 Text Interpretation:  Sinus rhythm No significant change since last tracing Confirmed by Tilden Fossa [id number]) on 03/27/2018 10:36:31 AM   Radiology Dg Chest 2 View  Result Date: 03/27/2018 CLINICAL DATA:  Shortness of breath for several days EXAM: CHEST - 2 VIEW COMPARISON:  None. FINDINGS: The heart size and mediastinal contours are within normal limits. Both lungs are clear. The visualized skeletal structures are unremarkable. IMPRESSION: No active cardiopulmonary disease. Electronically Signed   By: Alcide Clever M.D.   On: 03/27/2018 10:39    Procedures Procedures (including critical care time)  Medications Ordered in ED Medications - No data to display   Initial Impression / Assessment and Plan / ED Course  I have reviewed the triage vital signs and the nursing notes.  Pertinent labs & imaging results that were available during my care of the patient  were reviewed by me and considered in my medical decision making (see chart for details).     Patient here for evaluation of episode of shortness of breath with near syncope that occurred prior to ED arrival. On evaluation of the department he is completely asymptomatic. EKG with no acute ischemic changes. Chest x-ray does not demonstrate pulmonary nodule. Presentation is not consistent with pneumonia, CHF, PE, ACS. Discussed with patient checking labs to evaluate for electrolyte abnormality, anemia. Patient states he had recent labs performed at the [place] and he declines repeat lab draw. Plan to discharge home with outpatient follow-up and return precautions.  Final Clinical Impressions(s) / ED Diagnoses   Final diagnoses:  Shortness of breath  Near syncope    ED Discharge Orders  None       Tilden Fossa, MD 03/27/18 1558

## 2018-03-27 NOTE — ED Notes (Signed)
Patient transported to X-ray
